# Patient Record
Sex: Female | Born: 1960
Health system: Southern US, Community
[De-identification: ages and names within clinical notes are randomized; demographics above are authoritative.]

## PROBLEM LIST (undated history)

## (undated) DIAGNOSIS — M797 Fibromyalgia: Secondary | ICD-10-CM

## (undated) HISTORY — PX: ABDOMINAL HYSTERECTOMY: SHX81

## (undated) HISTORY — PX: ANKLE SURGERY: SHX546

## (undated) HISTORY — PX: GASTRIC BYPASS: SHX52

---

## 2018-03-22 ENCOUNTER — Other Ambulatory Visit: Payer: Self-pay | Admitting: Internal Medicine

## 2018-03-22 DIAGNOSIS — Z1231 Encounter for screening mammogram for malignant neoplasm of breast: Secondary | ICD-10-CM

## 2018-04-12 ENCOUNTER — Ambulatory Visit: Payer: Self-pay

## 2018-05-07 ENCOUNTER — Ambulatory Visit
Admission: RE | Admit: 2018-05-07 | Discharge: 2018-05-07 | Disposition: A | Discharge status: Home / Self Care | Payer: BLUE CROSS/BLUE SHIELD | Source: Ambulatory Visit | Attending: Internal Medicine | Admitting: Internal Medicine

## 2018-05-07 ENCOUNTER — Encounter: Payer: Self-pay | Admitting: Radiology

## 2018-05-07 DIAGNOSIS — Z1231 Encounter for screening mammogram for malignant neoplasm of breast: Secondary | ICD-10-CM

## 2018-09-19 ENCOUNTER — Ambulatory Visit: Payer: Self-pay | Admitting: Internal Medicine

## 2018-10-10 IMAGING — MG DIGITAL SCREENING BILATERAL MAMMOGRAM WITH CAD
4 series · 4 of 4 positions shown · non-contrast
Comparison: None.

ADDENDUM:
A prior outside mammogram from 04/11/2016 has become available. No
new findings are identified in light of this comparison exam. The
original impression and recommendation are unchanged.
CLINICAL DATA: Screening.

EXAM:
DIGITAL SCREENING BILATERAL MAMMOGRAM WITH CAD

[R CC]
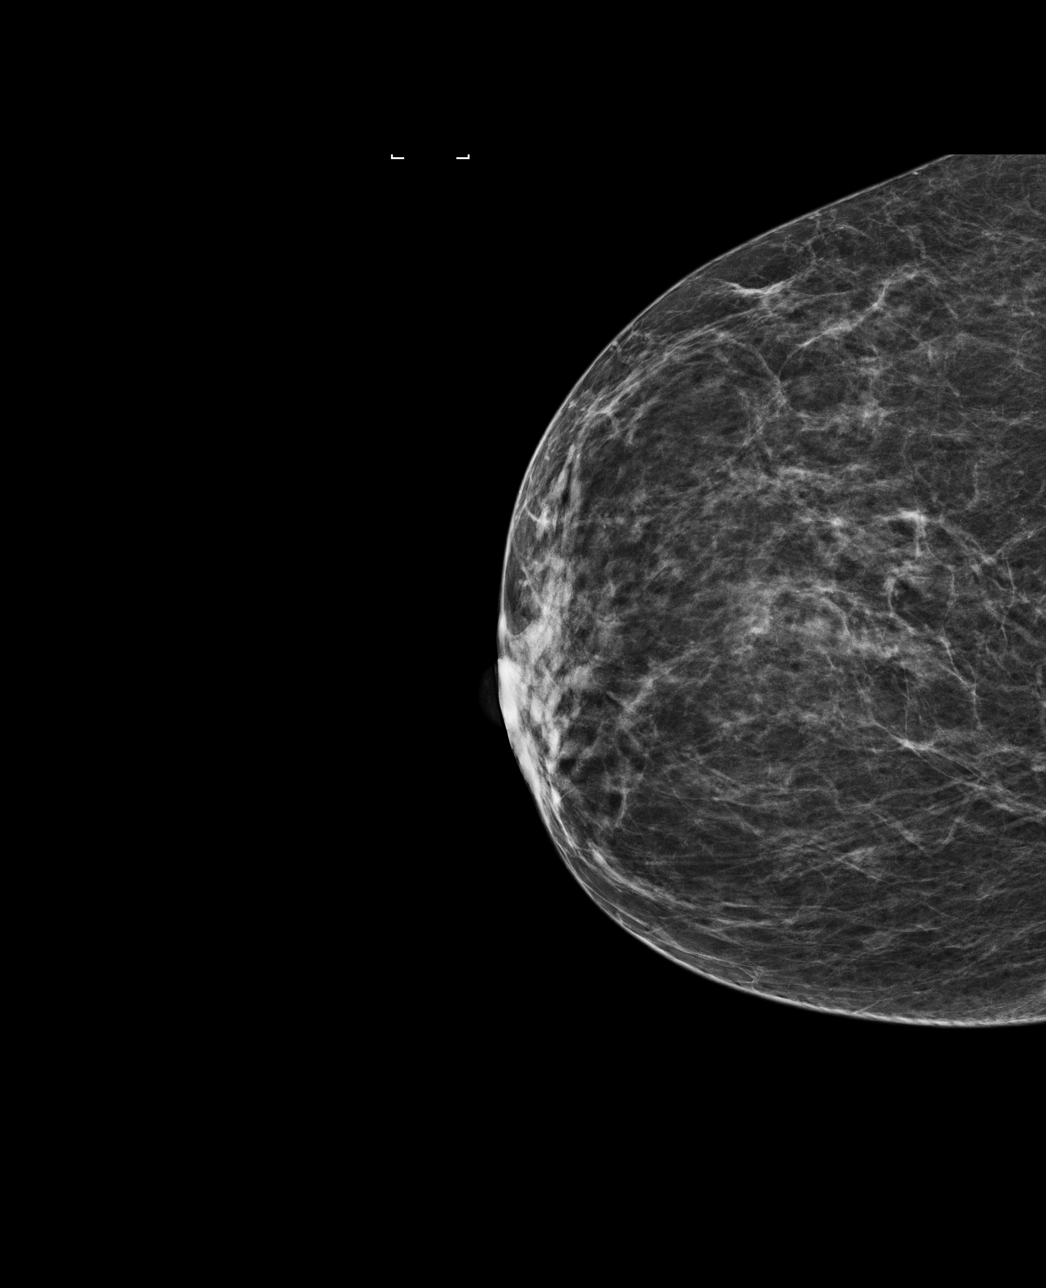

[L MLO]
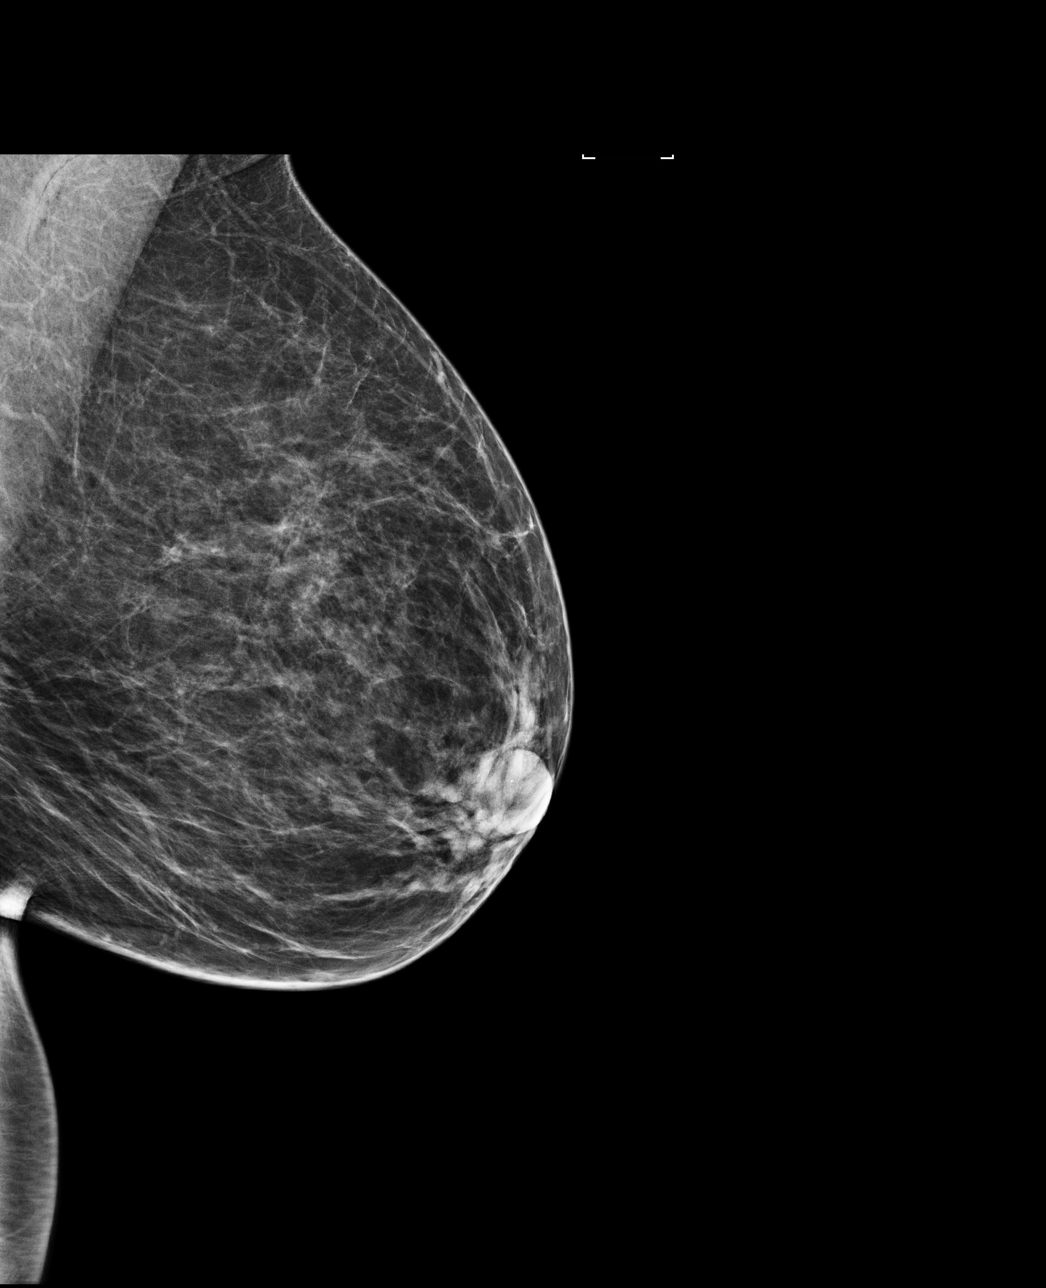

[L CC]
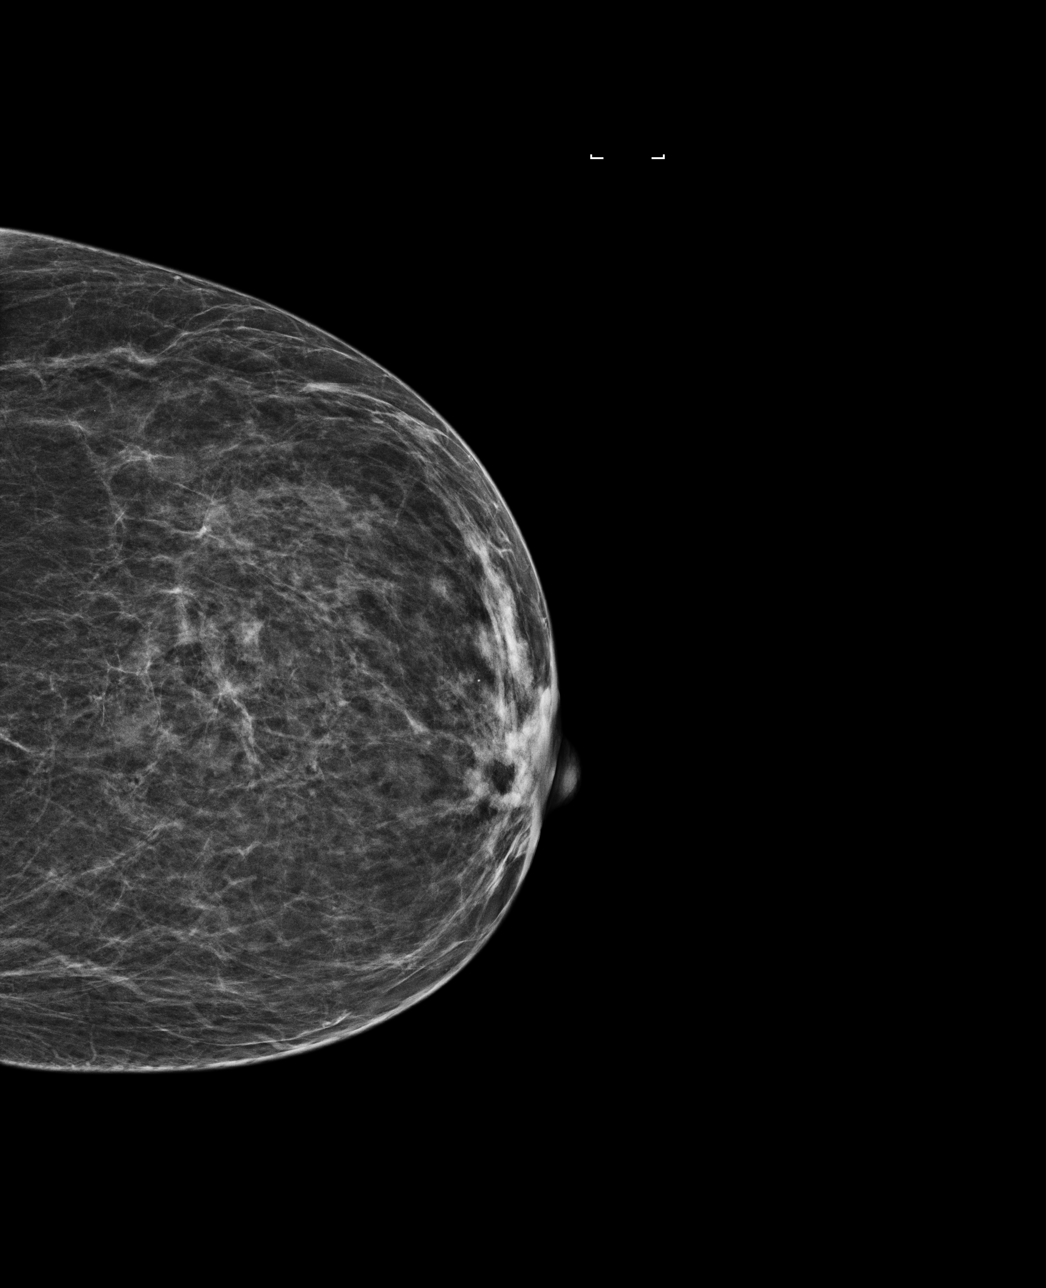

[R MLO]
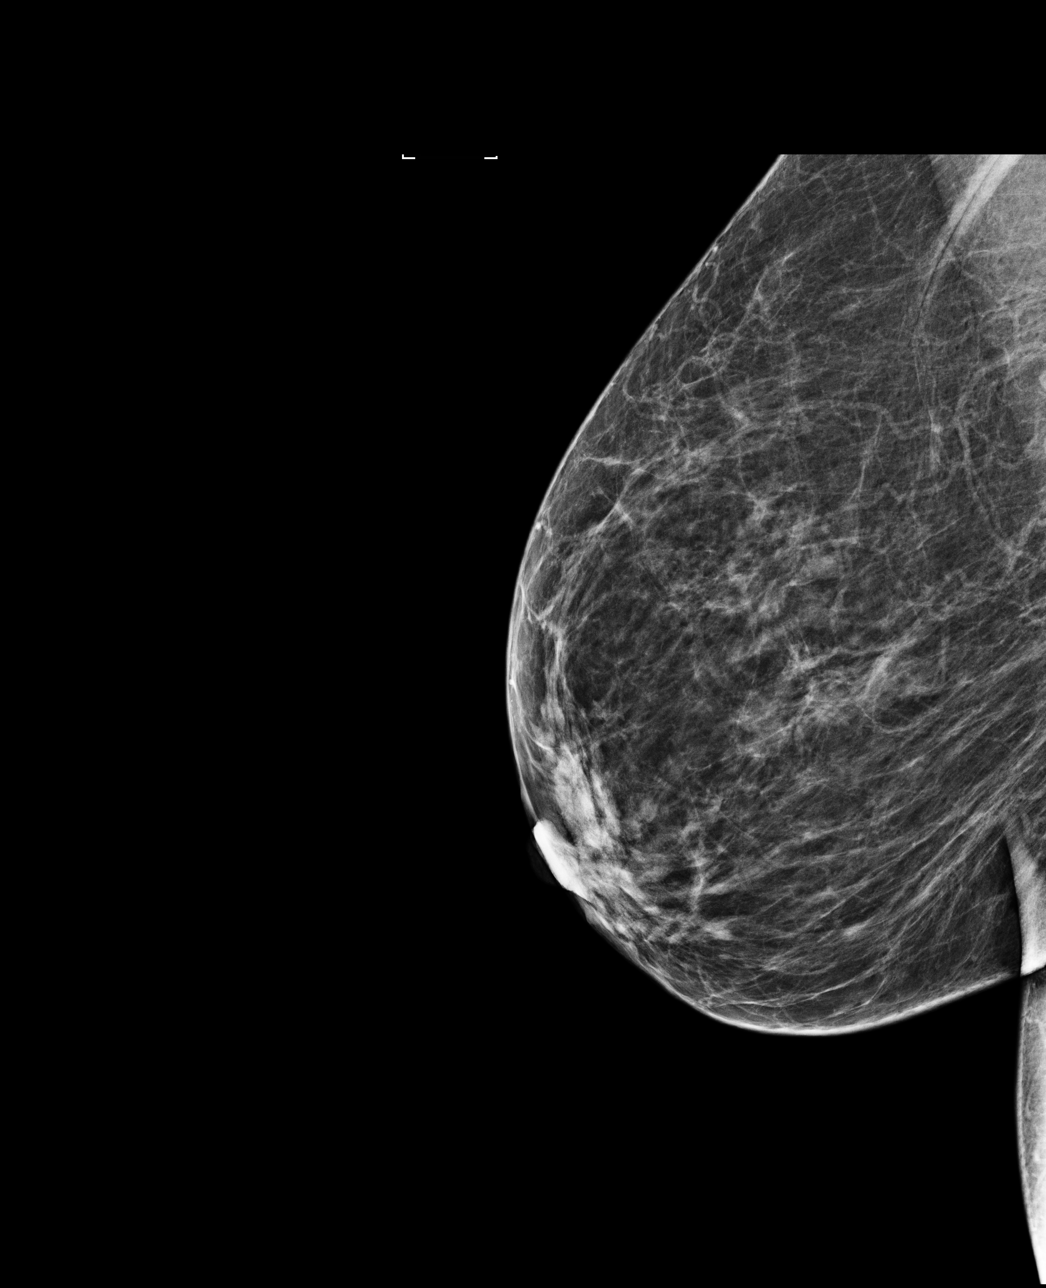

[4 of 4 positions shown; findings below may reference images not displayed]

ACR Breast Density Category b: There are scattered areas of
fibroglandular density.
FINDINGS: There are no findings suspicious for malignancy. Images were
processed with CAD.
IMPRESSION: No mammographic evidence of malignancy. A result letter of this
screening mammogram will be mailed directly to the patient.

RECOMMENDATION:
Screening mammogram in one year. (Code:61-G-5GP)

BI-RADS CATEGORY  1: Negative.

## 2018-11-14 ENCOUNTER — Emergency Department (HOSPITAL_BASED_OUTPATIENT_CLINIC_OR_DEPARTMENT_OTHER)
Admission: EM | Admit: 2018-11-14 | Discharge: 2018-11-14 | Disposition: A | Discharge status: Home / Self Care | Payer: Self-pay | Attending: Emergency Medicine | Admitting: Emergency Medicine

## 2018-11-14 ENCOUNTER — Other Ambulatory Visit: Payer: Self-pay

## 2018-11-14 ENCOUNTER — Emergency Department (HOSPITAL_BASED_OUTPATIENT_CLINIC_OR_DEPARTMENT_OTHER): Payer: Self-pay

## 2018-11-14 ENCOUNTER — Encounter (HOSPITAL_BASED_OUTPATIENT_CLINIC_OR_DEPARTMENT_OTHER): Payer: Self-pay | Admitting: Emergency Medicine

## 2018-11-14 DIAGNOSIS — R21 Rash and other nonspecific skin eruption: Secondary | ICD-10-CM | POA: Insufficient documentation

## 2018-11-14 DIAGNOSIS — F17200 Nicotine dependence, unspecified, uncomplicated: Secondary | ICD-10-CM | POA: Insufficient documentation

## 2018-11-14 DIAGNOSIS — M25561 Pain in right knee: Secondary | ICD-10-CM | POA: Insufficient documentation

## 2018-11-14 HISTORY — DX: Fibromyalgia: M79.7

## 2018-11-14 MED ORDER — CLOTRIMAZOLE 1 % EX CREA: TOPICAL_CREAM | CUTANEOUS | 0 refills | Status: AC

## 2018-11-14 MED FILL — CLOTRIMAZOLE 1% CREAM: 1 | 14 days supply | Qty: 28 | Fill #0

## 2018-11-14 NOTE — ED Triage Notes (Signed)
?   Ring worm to right upper arm and has shooting pain to right knee, ongoing problem since twisted it in Nov 2019

## 2018-11-14 NOTE — ED Provider Notes (Signed)
[facility] EMERGENCY DEPARTMENT Provider Note   CSN: [id number] Arrival date & time: 11/14/18  0233    History   Chief Complaint Chief Complaint  Patient presents with  . Knee Pain    HPI Kathleen Fernandez is a 58 y.o. female.     58 year old female with past medical history including fibromyalgia, gastric bypass who presents with right knee pain and rash.  Last November, she twisted her right knee and has had pain in her central, anterior knee since then.  She has never been evaluated for this pain before.  Pain is worse with certain movements.  No numbness or weakness.  She began having a circular rash on her right arm 4 days ago.  She put bleach on it to try to resolve the area but it has persisted.  It is mildly itchy.  No other areas of rash.  No recent illness or other symptoms.  The history is provided by the patient.  Knee Pain    Past Medical History:  Diagnosis Date  . Fibromyalgia     There are no active problems to display for this patient.   Past Surgical History:  Procedure Laterality Date  . ABDOMINAL HYSTERECTOMY    . ANKLE SURGERY Right   . CESAREAN SECTION    . GASTRIC BYPASS       OB History   No obstetric history on file.      Home Medications    Prior to Admission medications   Medication Sig Start Date End Date Taking? Authorizing Provider  clotrimazole (LOTRIMIN) 1 % cream Apply to affected area 2 times daily until rash resolves 11/14/18   , Ambrose Finland, MD    Family History Family History  Problem Relation Age of Onset  . Breast cancer Neg Hx     Social History Social History   Tobacco Use  . Smoking status: Current Some Day Smoker  . Smokeless tobacco: Never Used  Substance Use Topics  . Alcohol use: Never    Frequency: Never  . Drug use: Never     Allergies   Patient has no known allergies.   Review of Systems Review of Systems All other systems reviewed and are negative except that which was mentioned  in HPI   Physical Exam Updated Vital Signs BP 133/86 (BP Location: Left Arm)   Pulse (!) 102   Temp 98.3 F (36.8 C) (Oral)   Resp 16   Ht 5' 4.5" (1.638 m)   Wt 80.3 kg   SpO2 100%   BMI 29.91 kg/m   Physical Exam Vitals signs and nursing note reviewed.  Constitutional:      General: She is not in acute distress.    Appearance: She is well-developed.  HENT:     Head: Normocephalic and atraumatic.  Eyes:     Conjunctiva/sclera: Conjunctivae normal.  Neck:     Musculoskeletal: Neck supple.  Musculoskeletal: Normal range of motion.        General: No swelling, tenderness or deformity.     Comments: R knee without joint effusion or tenderness; normal ROM, no joint laxity, negative anterior/posterior drawer signs  Skin:    General: Skin is warm and dry.     Findings: Rash present. No erythema.     Comments: Circular area of skin darkening and scaling on R mid arm adjacent to anticubital fossa  Neurological:     Mental Status: She is alert and oriented to person, place, and time.  Psychiatric:  Judgment: Judgment normal.      ED Treatments / Results  Labs (all labs ordered are listed, but only abnormal results are displayed) Labs Reviewed - No data to display  EKG None  Radiology Dg Knee Complete 4 Views Right  Result Date: 11/14/2018 CLINICAL DATA:  Right knee pain.  No injury. EXAM: RIGHT KNEE - COMPLETE 4+ VIEW COMPARISON:  None. FINDINGS: No evidence of fracture, dislocation, or joint effusion. No evidence of arthropathy or other focal bone abnormality. Soft tissues are unremarkable. IMPRESSION: Negative. Electronically Signed   By: Charlett Nose M.D.   On: 11/14/2018 09:27    Procedures Procedures (including critical care time)  Medications Ordered in ED Medications - No data to display   Initial Impression / Assessment and Plan / ED Course  I have reviewed the triage vital signs and the nursing notes.  Pertinent labs & imaging results that were  available during my care of the patient were reviewed by me and considered in my medical decision making (see chart for details).       1. Rash--skin is dried out from bleaching the area, no history of eczema. Will start on topical antifungal. Explained that if area worsens with this med, discontinue and apply hydrating lotion and topical hydrocortisone instead.  2. Knee pain-- XR negative. Explained she could have ligamentous injury or meniscal injury.  Recommended sports medicine follow-up and discussed supportive measures.  Final Clinical Impressions(s) / ED Diagnoses   Final diagnoses:  Right knee pain, unspecified chronicity  Rash/skin eruption    ED Discharge Orders         Ordered    clotrimazole (LOTRIMIN) 1 % cream     11/14/18 1001           , Ambrose Finland, MD 11/14/18 1007

## 2018-11-28 ENCOUNTER — Encounter: Payer: Self-pay | Admitting: Internal Medicine
